# Patient Record
Sex: Male | Born: 1945 | Race: White | Hispanic: No | Marital: Married | Smoking: Former smoker
Health system: Southern US, Community
[De-identification: ages and names within clinical notes are randomized; demographics above are authoritative.]

## PROBLEM LIST (undated history)

## (undated) DIAGNOSIS — F988 Other specified behavioral and emotional disorders with onset usually occurring in childhood and adolescence: Secondary | ICD-10-CM

## (undated) DIAGNOSIS — I499 Cardiac arrhythmia, unspecified: Secondary | ICD-10-CM

## (undated) DIAGNOSIS — D649 Anemia, unspecified: Secondary | ICD-10-CM

## (undated) DIAGNOSIS — I10 Essential (primary) hypertension: Secondary | ICD-10-CM

## (undated) DIAGNOSIS — N200 Calculus of kidney: Secondary | ICD-10-CM

## (undated) DIAGNOSIS — E785 Hyperlipidemia, unspecified: Secondary | ICD-10-CM

## (undated) DIAGNOSIS — N189 Chronic kidney disease, unspecified: Secondary | ICD-10-CM

## (undated) DIAGNOSIS — I2510 Atherosclerotic heart disease of native coronary artery without angina pectoris: Secondary | ICD-10-CM

## (undated) DIAGNOSIS — I219 Acute myocardial infarction, unspecified: Secondary | ICD-10-CM

## (undated) HISTORY — PX: COLONOSCOPY: SHX174

## (undated) HISTORY — PX: HERNIA REPAIR: SHX51

---

## 2006-03-14 ENCOUNTER — Other Ambulatory Visit: Payer: Self-pay

## 2006-03-14 ENCOUNTER — Emergency Department: Payer: Self-pay

## 2006-04-01 ENCOUNTER — Ambulatory Visit: Payer: Self-pay | Admitting: Urology

## 2006-04-09 ENCOUNTER — Other Ambulatory Visit: Payer: Self-pay

## 2006-04-11 ENCOUNTER — Ambulatory Visit: Payer: Self-pay | Admitting: Urology

## 2006-07-17 ENCOUNTER — Ambulatory Visit: Payer: Self-pay | Admitting: Urology

## 2007-01-24 ENCOUNTER — Ambulatory Visit: Payer: Self-pay | Admitting: Urology

## 2007-07-29 ENCOUNTER — Ambulatory Visit: Payer: Self-pay | Admitting: Gastroenterology

## 2007-11-03 ENCOUNTER — Ambulatory Visit: Payer: Self-pay | Admitting: Urology

## 2008-05-27 ENCOUNTER — Other Ambulatory Visit: Payer: Self-pay

## 2008-05-27 ENCOUNTER — Inpatient Hospital Stay: Payer: Self-pay | Admitting: Internal Medicine

## 2008-12-28 ENCOUNTER — Emergency Department: Payer: Self-pay | Admitting: Emergency Medicine

## 2009-01-07 ENCOUNTER — Ambulatory Visit: Payer: Self-pay | Admitting: Urology

## 2009-07-09 IMAGING — CT CT STONE STUDY
1 of 2 series · 15 of 32 positions shown, 19 images · non-contrast
Comparison: none

REASON FOR EXAM: flank pain, eval for stone
COMMENTS:

[Series 2: stone · axial · 0.75mm/px · z∈[-802,-358]mm · 15 of 162 slices shown, 19 images]
[im 7/162  soft-tissue]
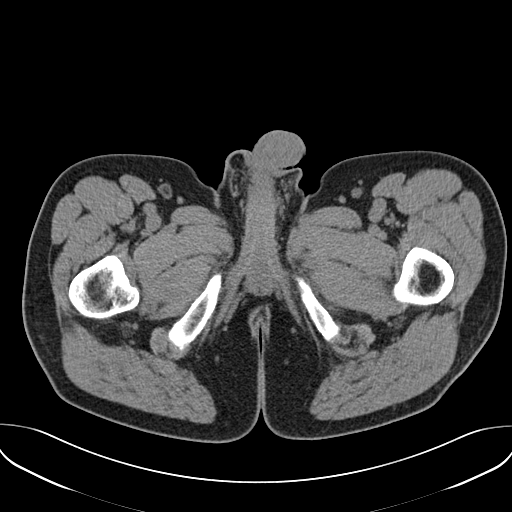
[im 7/162  bone]
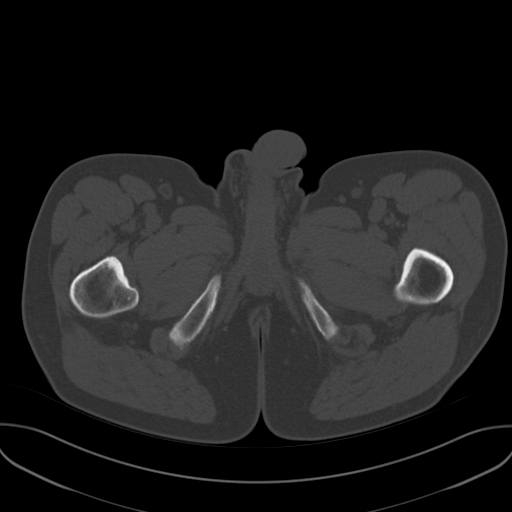
[im 20/162  soft-tissue]
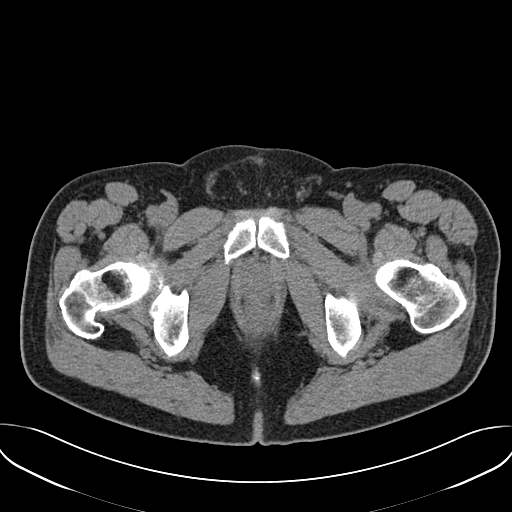
[im 33/162  soft-tissue]
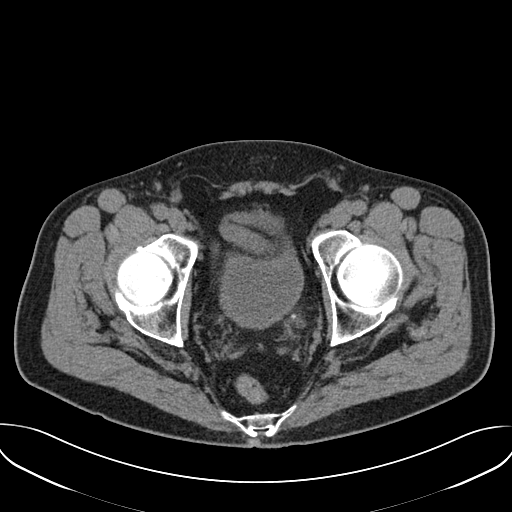
[im 46/162  soft-tissue]
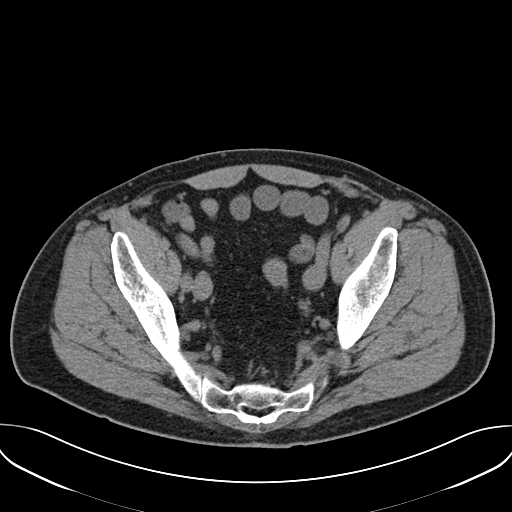
[im 58/162  soft-tissue]
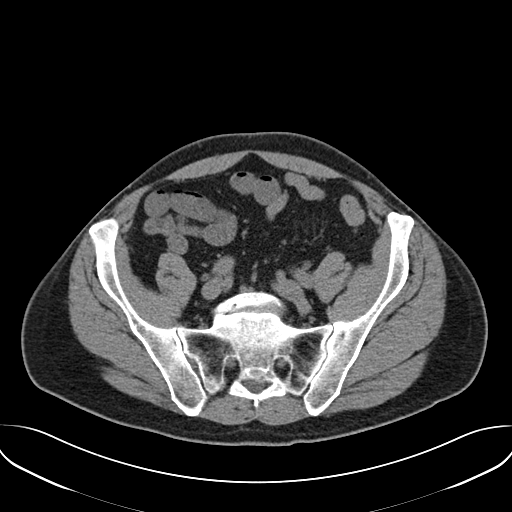
[im 71/162  soft-tissue]
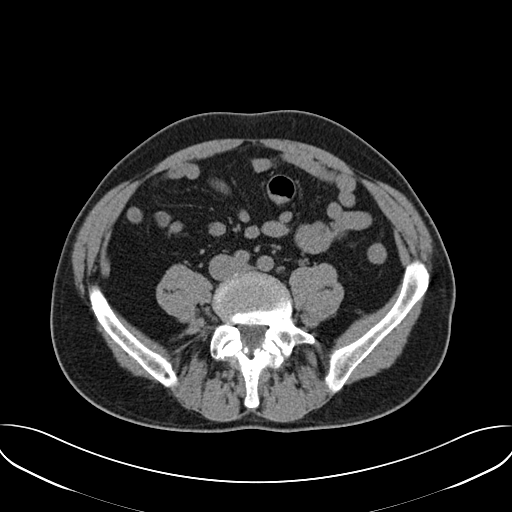
[im 84/162  soft-tissue]
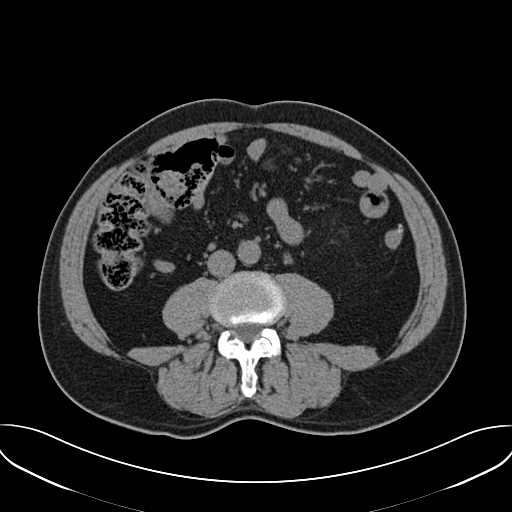
[im 91/162  soft-tissue]
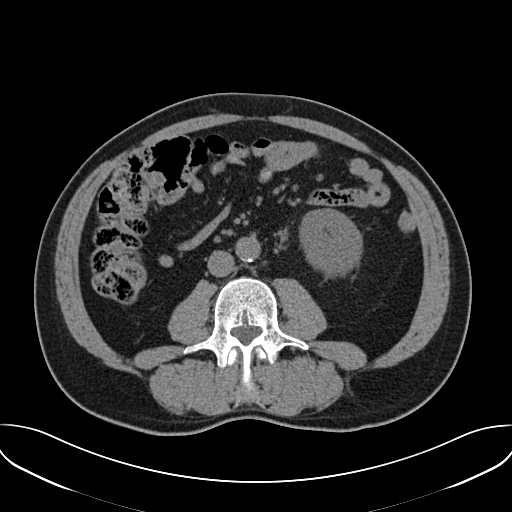
[im 104/162  soft-tissue]
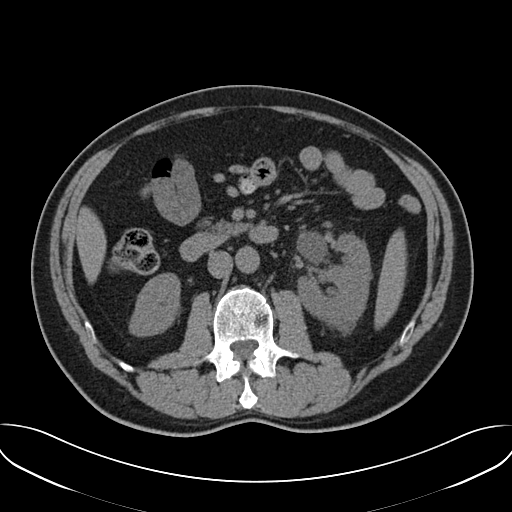
[im 104/162  bone]
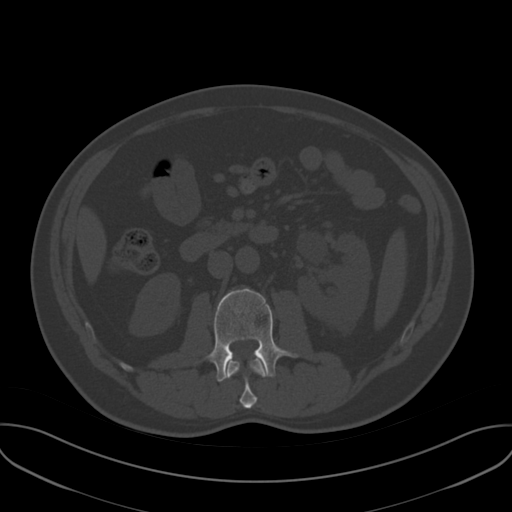
[im 116/162  soft-tissue]
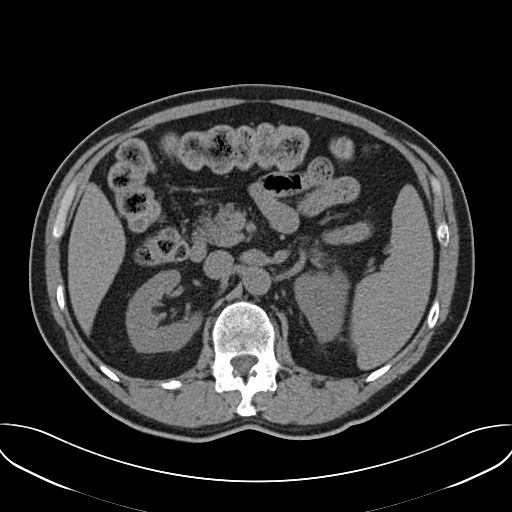
[im 129/162  soft-tissue]
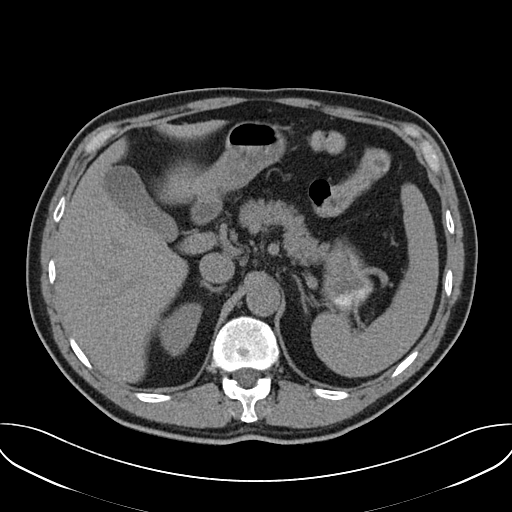
[im 136/162  lung]
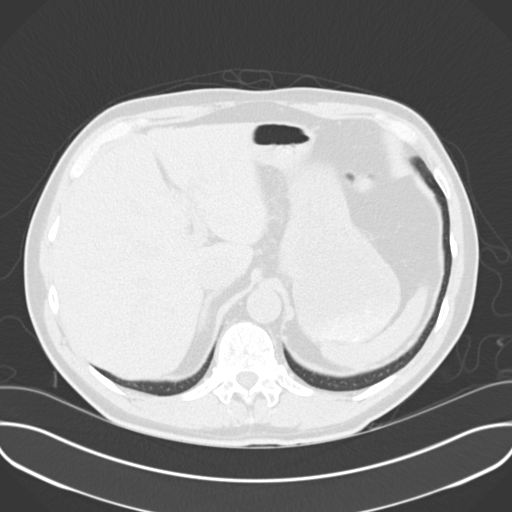
[im 142/162  soft-tissue]
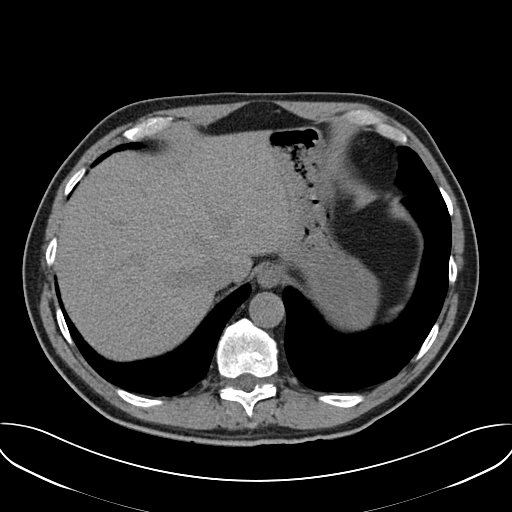
[im 142/162  lung]
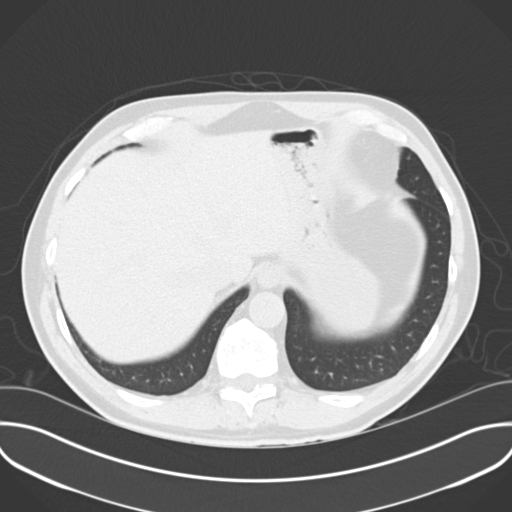
[im 149/162  lung]
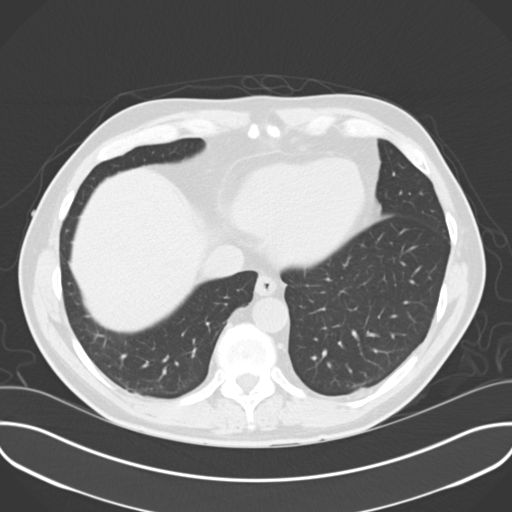
[im 155/162  soft-tissue]
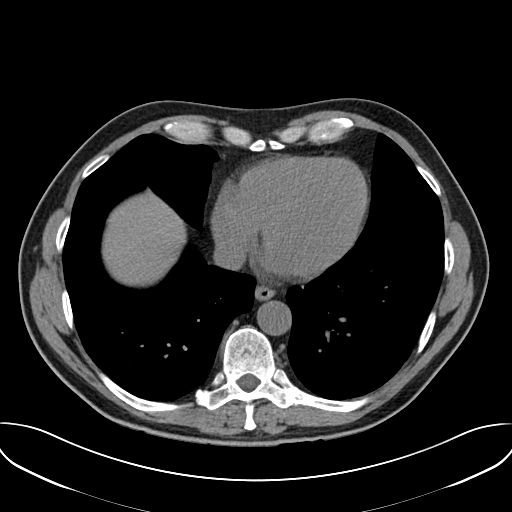
[im 155/162  lung]
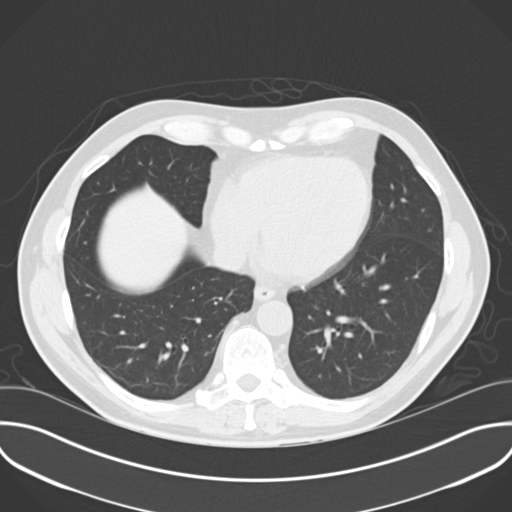

[15 of 32 positions shown; findings below may reference images not displayed]

PROCEDURE:     CT  - CT ABDOMEN /PELVIS WO (STONE)  - December 28, 2008  [DATE]

RESULT:     Axial noncontrast CT scanning was performed through the abdomen
and pelvis at 3 mm intervals and slice thicknesses. Comparison is made to
the study 14 March, 2006. The patient reports a history of urinary tract
stones. Review of 3-dimensional reconstructed images was performed
separately on the [employer] monitor.

On the left there is mild hydronephrosis as well as hydroureter secondary to
an approximately 3 mm diameter stone at the UVJ. This is demonstrated on
image 131. In addition there is a nonobstructing lower pole stone measuring
2 mm in diameter on the left. The right kidney exhibits no calcified stones
nor evidence of obstruction.

The liver, gallbladder, pancreas, spleen, partially distended stomach, and
adrenal glands are normal in appearance. The caliber of the abdominal aorta
is normal. There is no free fluid in the abdomen or pelvis. The partially
distended urinary bladder is normal in appearance. The prostate gland is
mildly enlarged. The lung bases are clear. The lumbar vertebral bodies are
preserved in height.
IMPRESSION: 1. On the left there is mild hydronephrosis and hydroureter secondary to a 3
mm diameter stone at the left ureterovesical junction. A nonobstructing
stone in a lower pole calyx on the left is present as well.
2. I do not see acute abnormality elsewhere within the abdomen or pelvis.

## 2009-07-25 ENCOUNTER — Ambulatory Visit: Payer: Self-pay | Admitting: Urology

## 2010-05-08 ENCOUNTER — Ambulatory Visit: Payer: Self-pay | Admitting: Urology

## 2010-11-17 IMAGING — CR DG ABDOMEN 1V
1 series · 1 of 1 positions shown · non-contrast
Comparison: none

REASON FOR EXAM: calculus of kidney
COMMENTS:

[view not recorded]
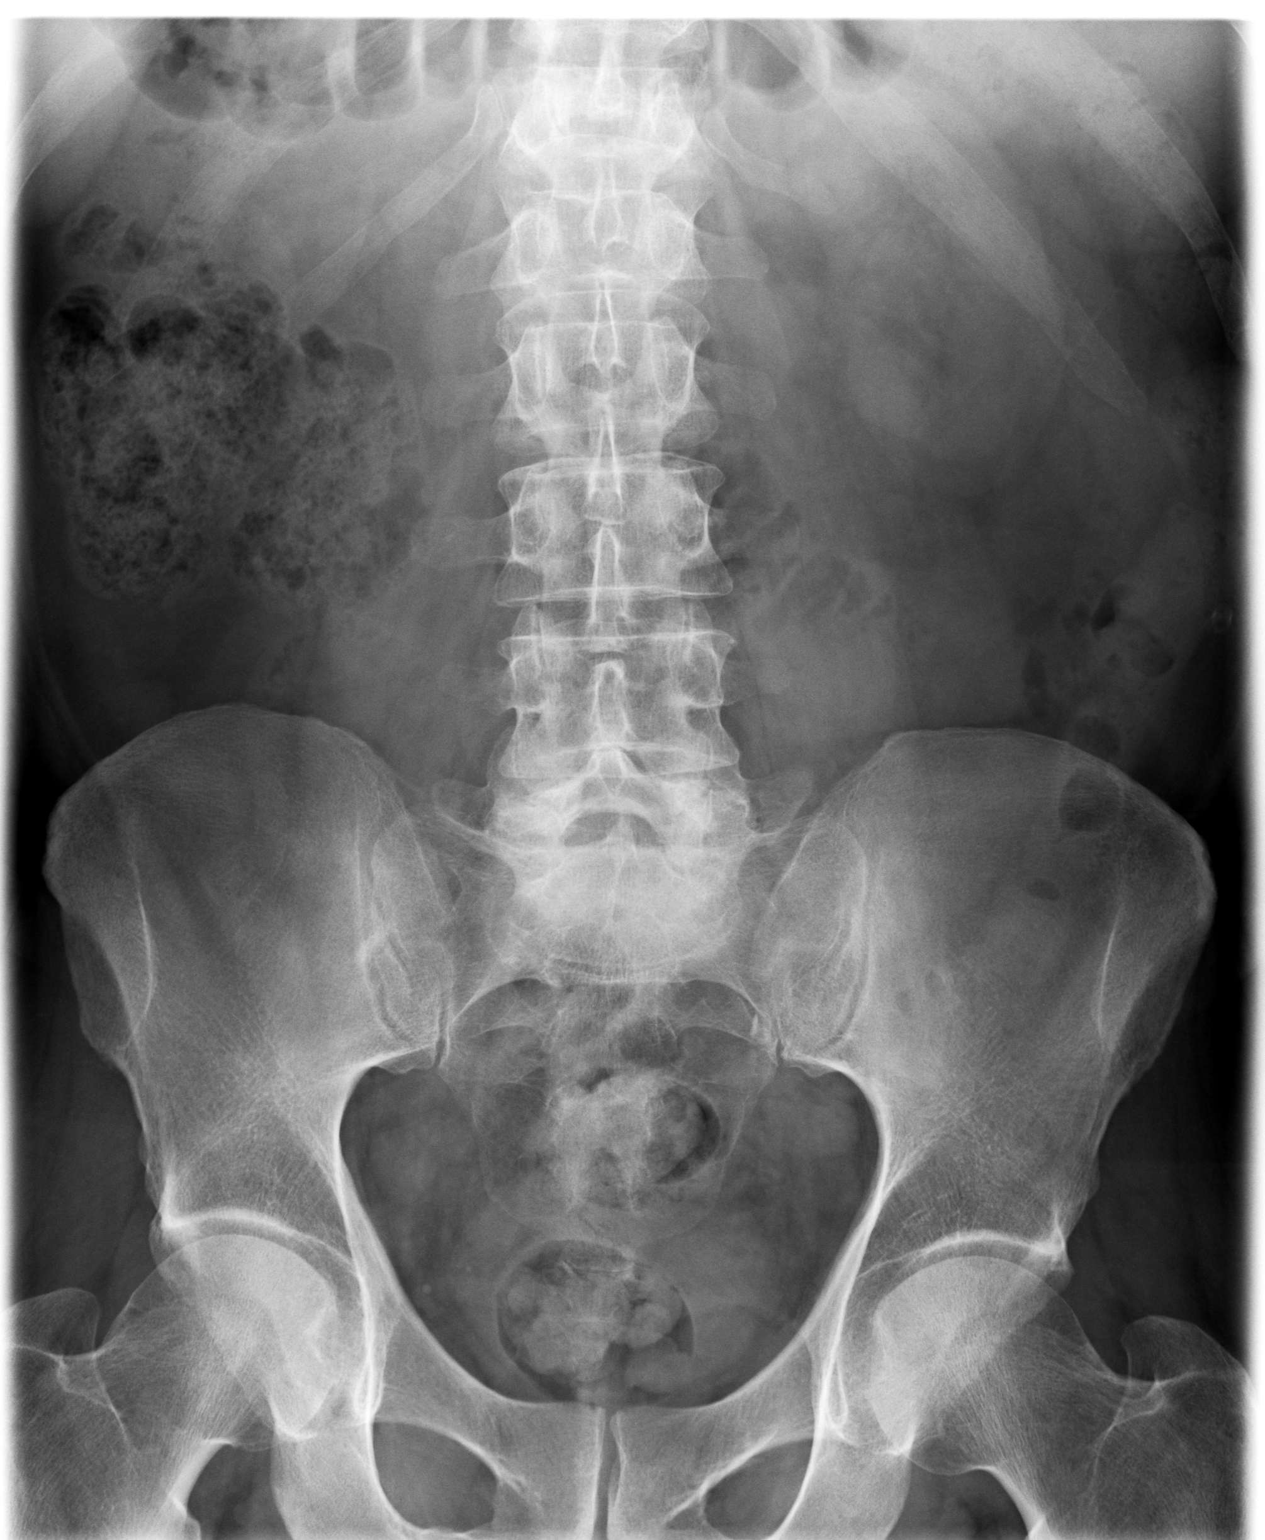

[1 of 1 positions shown; findings below may reference images not displayed]

PROCEDURE:     MDR - MDR KIDNEY URETER BLADDER  - May 08, 2010 [DATE]

RESULT:     Comparison is in June 2009.

The bony structures exhibit no acute abnormality. The bowel gas pattern does
not suggest ileus or obstruction. I cannot exclude constipation. I do not
see abnormal calcifications to suggest a kidney stone.
IMPRESSION: I see no acute intra-abdominal abnormality on this study. I
cannot exclude an element of constipation in the appropriate clinical
setting.

## 2013-03-24 ENCOUNTER — Other Ambulatory Visit: Payer: Self-pay | Admitting: Cardiology

## 2013-03-24 LAB — TROPONIN I: Troponin-I: <0.02 ng/mL

## 2017-01-03 ENCOUNTER — Encounter (INDEPENDENT_AMBULATORY_CARE_PROVIDER_SITE_OTHER): Payer: Medicare Other | Admitting: Ophthalmology

## 2017-01-03 DIAGNOSIS — H33301 Unspecified retinal break, right eye: Secondary | ICD-10-CM | POA: Diagnosis not present

## 2017-01-03 DIAGNOSIS — H2513 Age-related nuclear cataract, bilateral: Secondary | ICD-10-CM

## 2017-01-03 DIAGNOSIS — I10 Essential (primary) hypertension: Secondary | ICD-10-CM

## 2017-01-03 DIAGNOSIS — H35371 Puckering of macula, right eye: Secondary | ICD-10-CM | POA: Diagnosis not present

## 2017-01-03 DIAGNOSIS — H43813 Vitreous degeneration, bilateral: Secondary | ICD-10-CM

## 2017-01-03 DIAGNOSIS — H35033 Hypertensive retinopathy, bilateral: Secondary | ICD-10-CM

## 2017-01-24 ENCOUNTER — Ambulatory Visit (INDEPENDENT_AMBULATORY_CARE_PROVIDER_SITE_OTHER): Payer: Medicare Other | Admitting: Ophthalmology

## 2017-01-24 DIAGNOSIS — H33301 Unspecified retinal break, right eye: Secondary | ICD-10-CM

## 2017-05-27 ENCOUNTER — Ambulatory Visit (INDEPENDENT_AMBULATORY_CARE_PROVIDER_SITE_OTHER): Payer: Medicare Other | Admitting: Ophthalmology

## 2017-05-27 DIAGNOSIS — H35371 Puckering of macula, right eye: Secondary | ICD-10-CM

## 2017-05-27 DIAGNOSIS — H2513 Age-related nuclear cataract, bilateral: Secondary | ICD-10-CM

## 2017-05-27 DIAGNOSIS — H43813 Vitreous degeneration, bilateral: Secondary | ICD-10-CM

## 2017-05-27 DIAGNOSIS — H33301 Unspecified retinal break, right eye: Secondary | ICD-10-CM | POA: Diagnosis not present

## 2018-09-15 ENCOUNTER — Other Ambulatory Visit: Payer: Self-pay

## 2018-09-15 DIAGNOSIS — Z1211 Encounter for screening for malignant neoplasm of colon: Secondary | ICD-10-CM

## 2018-09-29 ENCOUNTER — Encounter: Payer: Self-pay | Admitting: *Deleted

## 2018-09-30 ENCOUNTER — Ambulatory Visit: Payer: Medicare Other | Admitting: Anesthesiology

## 2018-09-30 ENCOUNTER — Ambulatory Visit
Admission: RE | Admit: 2018-09-30 | Discharge: 2018-09-30 | Disposition: A | Discharge status: Home / Self Care | Payer: Medicare Other | Attending: Gastroenterology | Admitting: Gastroenterology

## 2018-09-30 ENCOUNTER — Encounter: Payer: Self-pay | Admitting: Anesthesiology

## 2018-09-30 ENCOUNTER — Encounter
Admission: RE | Disposition: A | Discharge status: Home / Self Care | Payer: Self-pay | Source: Home / Self Care | Attending: Gastroenterology

## 2018-09-30 DIAGNOSIS — E785 Hyperlipidemia, unspecified: Secondary | ICD-10-CM | POA: Diagnosis not present

## 2018-09-30 DIAGNOSIS — I129 Hypertensive chronic kidney disease with stage 1 through stage 4 chronic kidney disease, or unspecified chronic kidney disease: Secondary | ICD-10-CM | POA: Insufficient documentation

## 2018-09-30 DIAGNOSIS — I252 Old myocardial infarction: Secondary | ICD-10-CM | POA: Diagnosis not present

## 2018-09-30 DIAGNOSIS — Z79899 Other long term (current) drug therapy: Secondary | ICD-10-CM | POA: Diagnosis not present

## 2018-09-30 DIAGNOSIS — D120 Benign neoplasm of cecum: Secondary | ICD-10-CM

## 2018-09-30 DIAGNOSIS — Z1211 Encounter for screening for malignant neoplasm of colon: Secondary | ICD-10-CM | POA: Insufficient documentation

## 2018-09-30 DIAGNOSIS — I2510 Atherosclerotic heart disease of native coronary artery without angina pectoris: Secondary | ICD-10-CM | POA: Diagnosis not present

## 2018-09-30 DIAGNOSIS — K641 Second degree hemorrhoids: Secondary | ICD-10-CM | POA: Insufficient documentation

## 2018-09-30 DIAGNOSIS — K5730 Diverticulosis of large intestine without perforation or abscess without bleeding: Secondary | ICD-10-CM | POA: Insufficient documentation

## 2018-09-30 DIAGNOSIS — Z87891 Personal history of nicotine dependence: Secondary | ICD-10-CM | POA: Insufficient documentation

## 2018-09-30 DIAGNOSIS — Z7982 Long term (current) use of aspirin: Secondary | ICD-10-CM | POA: Diagnosis not present

## 2018-09-30 DIAGNOSIS — D123 Benign neoplasm of transverse colon: Secondary | ICD-10-CM | POA: Diagnosis not present

## 2018-09-30 DIAGNOSIS — N189 Chronic kidney disease, unspecified: Secondary | ICD-10-CM | POA: Insufficient documentation

## 2018-09-30 HISTORY — DX: Cardiac arrhythmia, unspecified: I49.9

## 2018-09-30 HISTORY — DX: Anemia, unspecified: D64.9

## 2018-09-30 HISTORY — DX: Essential (primary) hypertension: I10

## 2018-09-30 HISTORY — DX: Acute myocardial infarction, unspecified: I21.9

## 2018-09-30 HISTORY — DX: Other specified behavioral and emotional disorders with onset usually occurring in childhood and adolescence: F98.8

## 2018-09-30 HISTORY — DX: Chronic kidney disease, unspecified: N18.9

## 2018-09-30 HISTORY — DX: Hyperlipidemia, unspecified: E78.5

## 2018-09-30 HISTORY — PX: COLONOSCOPY WITH PROPOFOL: SHX5780

## 2018-09-30 HISTORY — DX: Calculus of kidney: N20.0

## 2018-09-30 HISTORY — DX: Atherosclerotic heart disease of native coronary artery without angina pectoris: I25.10

## 2018-09-30 SURGERY — COLONOSCOPY WITH PROPOFOL
Anesthesia: General

## 2018-09-30 MED ORDER — PROPOFOL 500 MG/50ML IV EMUL
INTRAVENOUS | Status: DC | PRN
  Administered 2018-09-30: 150 ug/kg/min via INTRAVENOUS

## 2018-09-30 MED ORDER — PROPOFOL 10 MG/ML IV BOLUS
INTRAVENOUS | Status: DC | PRN
  Administered 2018-09-30: 50 mg via INTRAVENOUS

## 2018-09-30 MED ORDER — SODIUM CHLORIDE 0.9 % IV SOLN
INTRAVENOUS | Status: DC
  Administered 2018-09-30: 09:00:00 via INTRAVENOUS

## 2018-09-30 MED ORDER — PROPOFOL 10 MG/ML IV BOLUS
INTRAVENOUS | Status: AC
  Filled 2018-09-30: qty 40

## 2018-09-30 NOTE — Anesthesia Preprocedure Evaluation (Addendum)
Anesthesia Evaluation  Patient identified by MRN, date of birth, ID band Patient awake    Reviewed: Allergy & Precautions, NPO status , Patient's Chart, lab work & pertinent test results, reviewed documented beta blocker date and time   Airway Mallampati: II  TM Distance: >3 FB     Dental  (+) Chipped   Pulmonary former smoker,           Cardiovascular hypertension, Pt. on medications and Pt. on home beta blockers + Past MI  + dysrhythmias      Neuro/Psych CVA    GI/Hepatic   Endo/Other    Renal/GU Renal disease     Musculoskeletal   Abdominal   Peds  Hematology   Anesthesia Other Findings   Reproductive/Obstetrics                            Anesthesia Physical Anesthesia Plan  ASA: III  Anesthesia Plan: General   Post-op Pain Management:    Induction: Intravenous  PONV Risk Score and Plan:   Airway Management Planned:   Additional Equipment:   Intra-op Plan:   Post-operative Plan:   Informed Consent: I have reviewed the patients History and Physical, chart, labs and discussed the procedure including the risks, benefits and alternatives for the proposed anesthesia with the patient or authorized representative who has indicated his/her understanding and acceptance.       Plan Discussed with: CRNA  Anesthesia Plan Comments:         Anesthesia Quick Evaluation

## 2018-09-30 NOTE — Anesthesia Postprocedure Evaluation (Signed)
Anesthesia Post Note  Patient: Jonathan Sexton  Procedure(s) Performed: COLONOSCOPY WITH PROPOFOL (N/A )  Patient location during evaluation: Endoscopy Anesthesia Type: General Level of consciousness: awake and alert Pain management: pain level controlled Vital Signs Assessment: post-procedure vital signs reviewed and stable Respiratory status: spontaneous breathing, nonlabored ventilation, respiratory function stable and patient connected to nasal cannula oxygen Cardiovascular status: blood pressure returned to baseline and stable Postop Assessment: no apparent nausea or vomiting Anesthetic complications: no     Last Vitals:  Vitals:   09/30/18 0951 09/30/18 1000  BP:  110/75  Pulse: 74 (!) 55  Resp: 14 14  Temp:    SpO2: 99% 98%    Last Pain:  Vitals:   09/30/18 0938  TempSrc: Tympanic                 Allisa Einspahr S

## 2018-09-30 NOTE — Op Note (Signed)
[facility] Gastroenterology Patient Name: Jonathan Sexton Procedure Date: 09/30/2018 9:14 AM MRN: [id number] Account #: [id number] Date of Birth: [birth year] Admit Type: Outpatient Age: 73 Room: [facility] ENDO ROOM 4 Gender: Male Note Status: Finalized Procedure:            Colonoscopy Indications:          Screening for colorectal malignant neoplasm Providers:            Lucilla Lame MD, MD Referring MD:         Sofie Hartigan (Referring MD) Medicines:            Propofol per Anesthesia Complications:        No immediate complications. Procedure:            Pre-Anesthesia Assessment:                       - Prior to the procedure, a History and Physical was                        performed, and patient medications and allergies were                        reviewed. The patient's tolerance of previous                        anesthesia was also reviewed. The risks and benefits of                        the procedure and the sedation options and risks were                        discussed with the patient. All questions were                        answered, and informed consent was obtained. Prior                        Anticoagulants: The patient has taken no previous                        anticoagulant or antiplatelet agents. ASA Grade                        Assessment: II - A patient with mild systemic disease.                        After reviewing the risks and benefits, the patient was                        deemed in satisfactory condition to undergo the                        procedure.                       After obtaining informed consent, the colonoscope was                        passed under direct vision. Throughout the procedure,  the patient's blood pressure, pulse, and oxygen                        saturations were monitored continuously. The                        Colonoscope was introduced through the anus and       advanced to the the cecum, identified by appendiceal                        orifice and ileocecal valve. The colonoscopy was                        performed without difficulty. The patient tolerated the                        procedure well. The quality of the bowel preparation                        was excellent. Findings:      The perianal and digital rectal examinations were normal.      A 6 mm polyp was found in the cecum. The polyp was sessile. The polyp       was removed with a cold snare. Resection and retrieval were complete.      A 2 mm polyp was found in the transverse colon. The polyp was sessile.       The polyp was removed with a cold biopsy forceps. Resection and       retrieval were complete.      Multiple small-mouthed diverticula were found in the sigmoid colon.      Non-bleeding internal hemorrhoids were found during retroflexion. The       hemorrhoids were Grade II (internal hemorrhoids that prolapse but reduce       spontaneously). Impression:           - One 6 mm polyp in the cecum, removed with a cold                        snare. Resected and retrieved.                       - One 2 mm polyp in the transverse colon, removed with                        a cold biopsy forceps. Resected and retrieved.                       - Diverticulosis in the sigmoid colon.                       - Non-bleeding internal hemorrhoids. Recommendation:       - Discharge patient to home.                       - Resume previous diet.                       - Continue present medications.                       - Await pathology results.                       -  Repeat colonoscopy in 5 years if polyp adenoma and 10                        years if hyperplastic Procedure Code(s):    --- Professional ---                       [phone], Colonoscopy, flexible; with removal of tumor(s),                        polyp(s), or other lesion(s) by snare technique                       [id number], 67,  Colonoscopy, flexible; with biopsy, single                        or multiple Diagnosis Code(s):    --- Professional ---                       Z12.11, Encounter for screening for malignant neoplasm                        of colon                       D12.0, Benign neoplasm of cecum                       D12.3, Benign neoplasm of transverse colon (hepatic                        flexure or splenic flexure) CPT copyright 2018 American Medical Association. All rights reserved. The codes documented in this report are preliminary and upon coder review may  be revised to meet current compliance requirements. Lucilla Lame MD, MD 09/30/2018 9:33:41 AM This report has been signed electronically. Number of Addenda: 0 Note Initiated On: 09/30/2018 9:14 AM Scope Withdrawal Time: 0 hours 7 minutes 50 seconds  Total Procedure Duration: 0 hours 10 minutes 17 seconds       [facility]

## 2018-09-30 NOTE — H&P (Signed)
Lucilla Lame, MD [address] [place], [place] [address] Phone: [phone] Fax : [phone]  Primary Care Physician:  Sofie Hartigan, MD Primary Gastroenterologist:  Dr. Allen Norris  Pre-Procedure History & Physical: HPI:  Jonathan Sexton is a 73 y.o. male is here for a screening colonoscopy.   Past Medical History:  Diagnosis Date  . ADD (attention deficit disorder)   . Anemia   . Chronic kidney disease   . Coronary artery disease   . Dysrhythmia   . Hyperlipidemia   . Hypertension   . Kidney stones   . Kidney stones   . Myocardial infarction [facility])     Past Surgical History:  Procedure Laterality Date  . COLONOSCOPY    . HERNIA REPAIR      Prior to Admission medications   Medication Sig Start Date End Date Taking? Authorizing Provider  amphetamine-dextroamphetamine (ADDERALL XR) 20 MG 24 hr capsule Take 20 mg by mouth daily.   Yes [provider]  aspirin EC 81 MG tablet Take 81 mg by mouth daily.   Yes [provider]  metoprolol succinate (TOPROL-XL) 100 MG 24 hr tablet Take 100 mg by mouth daily. Take with or immediately following a meal.   Yes [provider]  nitroGLYCERIN (NITROSTAT) 0.4 MG SL tablet Place 0.4 mg under the tongue every 5 (five) minutes as needed for chest pain.   Yes [provider]  simvastatin (ZOCOR) 40 MG tablet Take 40 mg by mouth daily.   Yes [provider]  tamsulosin (FLOMAX) 0.4 MG CAPS capsule Take 0.4 mg by mouth.   Yes [provider]    Allergies as of 09/16/2018  . (Not on File)    History reviewed. No pertinent family history.  Social History   Socioeconomic History  . Marital status: Married    Spouse name: Not on file  . Number of children: Not on file  . Years of education: Not on file  . Highest education level: Not on file  Occupational History  . Not on file  Social Needs  . Financial resource strain: Not on file  . Food insecurity:   Worry: Not on file    Inability: Not on file  . Transportation needs:    Medical: Not on file    Non-medical: Not on file  Tobacco Use  . Smoking status: Former Research scientist (life sciences)  . Smokeless tobacco: Never Used  Substance and Sexual Activity  . Alcohol use: Yes    Comment: rarely,none last 24hrs  . Drug use: Not on file  . Sexual activity: Not on file  Lifestyle  . Physical activity:    Days per week: Not on file    Minutes per session: Not on file  . Stress: Not on file  Relationships  . Social connections:    Talks on phone: Not on file    Gets together: Not on file    Attends religious service: Not on file    Active member of club or organization: Not on file    Attends meetings of clubs or organizations: Not on file    Relationship status: Not on file  . Intimate partner violence:    Fear of current or ex partner: Not on file    Emotionally abused: Not on file    Physically abused: Not on file    Forced sexual activity: Not on file  Other Topics Concern  . Not on file  Social History Narrative  . Not on file  Review of Systems: See HPI, otherwise negative ROS  Physical Exam: BP (!) 152/88   Pulse (!) 58   Temp (!) 96.7 F (35.9 C) (Tympanic)   Resp 20   Ht 5\' 10"  (1.778 m)   Wt 90.3 kg   SpO2 95%   BMI 28.55 kg/m  General:   Alert,  pleasant and cooperative in NAD Head:  Normocephalic and atraumatic. Neck:  Supple; no masses or thyromegaly. Lungs:  Clear throughout to auscultation.    Heart:  Regular rate and rhythm. Abdomen:  Soft, nontender and nondistended. Normal bowel sounds, without guarding, and without rebound.   Neurologic:  Alert and  oriented x4;  grossly normal neurologically.  Impression/Plan: Jonathan Sexton is now here to undergo a screening colonoscopy.  Risks, benefits, and alternatives regarding colonoscopy have been reviewed with the patient.  Questions have been answered.  All parties agreeable.

## 2018-09-30 NOTE — Transfer of Care (Signed)
Immediate Anesthesia Transfer of Care Note  Patient: Jonathan Sexton  Procedure(s) Performed: COLONOSCOPY WITH PROPOFOL (N/A )  Patient Location: Endoscopy Unit  Anesthesia Type:General  Level of Consciousness: awake, alert  and oriented  Airway & Oxygen Therapy: Patient Spontanous Breathing and Patient connected to nasal cannula oxygen  Post-op Assessment: Report given to RN and Post -op Vital signs reviewed and stable  Post vital signs: Reviewed and stable  Last Vitals:  Vitals Value Taken Time  BP    Temp    Pulse    Resp    SpO2      Last Pain:  Vitals:   09/30/18 0904  TempSrc: Tympanic         Complications: No apparent anesthesia complications

## 2018-09-30 NOTE — Anesthesia Post-op Follow-up Note (Signed)
Anesthesia QCDR form completed.        

## 2018-10-01 ENCOUNTER — Encounter: Payer: Self-pay | Admitting: Gastroenterology

## 2018-10-01 LAB — SURGICAL PATHOLOGY

## 2023-12-10 NOTE — Progress Notes (Signed)
 Jonathan Sexton is a 78 y.o. male that comes today for the following problem(s):   Chief Complaint  Patient presents with  . Follow-up    fasting    HPI: Patient in the office for follow-up of chronic medical additions including hypertension, hyperlipidemia, ADD and kidney stones.  He reports he is feeling well and is without acute complaint or concern.  His blood pressures have remained well-controlled with the metoprolol.  He denies headaches, vision changes, speech problems, focal neurologic symptoms, chest pain, shortness of breath or palpitations.  He he continues to take his simvastatin regularly for the control of his cholesterol.  He is trying to eat right and remains active.  He notes he exercises on the treadmill regularly.  He states his ADD remains well-controlled with the Adderall.  He notes he is able to focus on his tasks better and complete them in a more timely fashion with the assistance of the medication.  He denies problems with kidney stones between visits and continues to take his tamsulosin regularly.  He denies flank pain or hematuria.  He denies side effect problems with his medications.   Goals     . Maintain health/healthy lifestyle        Patient Active Problem List  Diagnosis  . Anemia, unspecified  . Coronary atherosclerosis of native coronary artery  . HLD (hyperlipidemia), unspecified  . Pure hypercholesterolemia  . ADD (attention deficit disorder)  . Hyperlipidemia  . Hypertension  . Kidney stones     Past Medical History:  Diagnosis Date  . ADD (attention deficit disorder)   . Anemia, unspecified   . Coronary atherosclerosis of native coronary artery   . HLD (hyperlipidemia)   . Hypertension   . Pure hypercholesterolemia      Past Surgical History:  Procedure Laterality Date  . CARDIAC CATHETERIZATION    . HERNIA REPAIR     Left hernia    Social History   Socioeconomic History  . Marital status: Married  Tobacco Use  . Smoking  status: Former    Types: Cigarettes  . Smokeless tobacco: Never  . Tobacco comments:    quit 1972  Substance and Sexual Activity  . Alcohol use: Yes    Alcohol/week: 1.0 - 2.0 standard drink of alcohol    Types: 1 - 2 Standard drinks or equivalent per week    Comment: occasionally  . Drug use: No   Social Drivers of Corporate Investment Banker Strain: Low Risk  (04/08/2023)   Overall Financial Resource Strain (CARDIA)   . Difficulty of Paying Living Expenses: Not hard at all  Food Insecurity: No Food Insecurity (04/08/2023)   Hunger Vital Sign   . Worried About Programme Researcher, Broadcasting/film/video in the Last Year: Never true   . Ran Out of Food in the Last Year: Never true  Transportation Needs: No Transportation Needs (04/08/2023)   PRAPARE - Transportation   . Lack of Transportation (Medical): No   . Lack of Transportation (Non-Medical): No  Housing Stability: Unknown (04/08/2023)   Housing Stability Vital Sign   . Unable to Pay for Housing in the Last Year: No   . Homeless in the Last Year: No    Family History  Problem Relation Name Age of Onset  . ADD / ADHD Son    . Diabetes Father    . Stroke Other         family hx     No Known Allergies  Prior to Admission medications  Medication Sig Taking? Last Dose  aspirin 81 MG EC tablet Take 81 mg by mouth once daily. Yes Taking  dextroamphetamine-amphetamine (ADDERALL XR) 15 MG XR capsule Take 1 capsule (15 mg total) by mouth every morning Yes Taking  glucosamine-chondroitin 500-400 mg tablet Take 1 tablet by mouth 2 (two) times daily. Yes Taking  metoprolol succinate (TOPROL-XL) 100 MG XL tablet TAKE 1 TABLET BY MOUTH ONCE  DAILY Yes Taking  multivitamin tablet Take 1 tablet by mouth once daily. Yes Taking  nitroGLYcerin (NITROSTAT) 0.4 MG SL tablet Place 0.4 mg under the tongue every 5 (five) minutes as needed for Chest pain May take up to 3 doses. Yes PRN Not Currently Taking  simvastatin (ZOCOR) 40 MG tablet TAKE 1 TABLET BY MOUTH  EVERY  NIGHT Yes Taking  tamsulosin (FLOMAX) 0.4 mg capsule TAKE 1 CAPSULE BY MOUTH 1 TIME  DAILY 30 MINUTES AFTER THE SAME  MEAL EACH DAY Yes Taking     Objective:  BP (!) 126/90 (BP Location: Left upper arm, Patient Position: Sitting, BP Cuff Size: Large Adult)   Pulse 68   Ht 177.8 cm (5' 10)   Wt 90.7 kg (200 lb)   SpO2 96%   BMI 28.70 kg/m   Physical Examination:  GENERAL:  The patient is alert, oriented and in no acute distress.  HEENT:  Head is normocephalic/atraumatic.  Pupils equal, round and reactive to light and accommodation.  Extraocular movements intact.  Nose and throat are clear. NECK/LYMPHATICS:  Supple without thyromegaly or lymphadenopathy.   RESPIRATORY:  Chest wall is within normal limits.   Clear to auscultation and percussion.   CARDIAC:  Regular rate and rhythm, normal S1 and S2 without murmurs, rubs or gallops.   VASCULAR:  Distal pulses 2+.   GASTROINTESTINAL  Soft. No organomegaly or tenderness found.    MUSCULOSKELETAL:  No cyanosis, clubbing or edema noted.   NEUROLOGIC:  The patient is alert and oriented x 3.  No focal deficits.       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  Encouraged regular aerobic activity 3-4 days per week. Reviewed most recent labs with patient.  Await above ordered labs Continue to work on coping mechanisms for ADD Continue to stay well-hydrated to avoid problems with kidney stones  Return in about 6 months (around 06/10/2024) for Regular follow up,  Fasting.

## 2024-04-01 NOTE — Progress Notes (Signed)
 Established Patient Visit   Chief Complaint: Chief Complaint  Patient presents with  . Coronary Artery Disease  . Hypertension    One year   Date of Service: 04/01/2024 Date of Birth: 1945/10/27 PCP: Jeffie Cheryl Therman Mickey., MD  History of Present Illness: Jonathan Sexton is a 78 y.o.male patient who returns for   1.  Essential hypertension  2.  Hyperlipidemia  3.  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  LBBB  The patient returns today for a 1 year follow-up and reports doing better.  He denies chest pain or shortness of breath. He denies palpitations or heart racing. He denies peripheral edema. He denies presyncope or syncope. The patient is active by walking on his treadmill at an incline 2x/week. ECG 03/06/2023 reveals sinus rhythm at a rate of 63 bpm with 1st degree AV block and known LBBB.  The patient underwent ETT sestamibi study on 03/25/2013 and exercised 8 minutes and 5 seconds on a Bruce protocol without chest pain, ECG changes or evidence of exercise-induced perfusion defects.     The patient has essential hypertension, systolic blood pressure mildly elevated, currently on metoprolol succinate, which is well-tolerated without apparent side effects.blood pressures at home have been in normal range.  The patient tries to follow a low-sodium, no added salt diet.   The patient has hyperlipidemia; LDL cholesterol was 65 on/15/2025 on simvastatin, which is well tolerated without apparent side effects, followed by his primary care provider.    Past Medical and Surgical History  Past Medical History Past Medical History:  Diagnosis Date  . ADD (attention deficit disorder)   . Anemia, unspecified   . Coronary atherosclerosis of native coronary artery   . HLD (hyperlipidemia)   . Hypertension   . Pure hypercholesterolemia     Past Surgical History He has a past surgical history that includes Cardiac catheterization and Hernia repair.    Medications and Allergies  Current Medications  Current Outpatient Medications  Medication Sig Dispense Refill  . aspirin 81 MG EC tablet Take 81 mg by mouth once daily.    SABRA dextroamphetamine-amphetamine (ADDERALL XR) 15 MG XR capsule Take 1 capsule (15 mg total) by mouth every morning 30 capsule 0  . glucosamine-chondroitin 500-400 mg tablet Take 1 tablet by mouth 2 (two) times daily.    . metoprolol SUCCinate (TOPROL-XL) 100 MG XL tablet TAKE 1 TABLET BY MOUTH ONCE  DAILY 100 tablet 2  . multivitamin tablet Take 1 tablet by mouth once daily.    . nitroGLYcerin (NITROSTAT) 0.4 MG SL tablet Place 0.4 mg under the tongue every 5 (five) minutes as needed for Chest pain May take up to 3 doses.    . simvastatin (ZOCOR) 40 MG tablet TAKE 1 TABLET BY MOUTH EVERY  NIGHT 100 tablet 2  . tamsulosin (FLOMAX) 0.4 mg capsule TAKE 1 CAPSULE BY MOUTH 1 TIME  DAILY 30 MINUTES AFTER THE SAME  MEAL EACH DAY 100 capsule 3   No current facility-administered medications for this visit.    Allergies: Patient has no known allergies.  Social and Family History  Social History  reports that he has quit smoking. His smoking use included cigarettes. He has never used smokeless tobacco. He reports current alcohol use of about 1.0 - 2.0 standard drink of alcohol per week. He reports that he does not use drugs.  Family History Family History  Problem Relation Name Age of Onset  . ADD / ADHD Son    .  Diabetes Father    . Stroke Other         family hx    Review of Systems   Review of Systems: The patient denies chest pain, shortness of breath, orthopnea, paroxysmal nocturnal dyspnea, pedal edema, palpitations, heart racing, presyncope, syncope, with fatigue this morning. Review of 10 Systems is negative except as described above.  Physical Examination   Vitals: BP (!) 150/82   Pulse 78   Ht 177.8 cm (5' 10)   Wt 89.4 kg (197 lb)   SpO2 96%   BMI 28.27 kg/m  Ht:177.8 cm (5' 10) Wt:89.4 kg (197  lb) ADJ:Anib surface area is 2.1 meters squared. Body mass index is 28.27 kg/m.  General: Alert and oriented. Well-appearing. No acute distress. HEENT: Pupils equally reactive to light and accomodation    Neck: Supple, no JVD Lungs: Normal effort of breathing; clear to auscultation bilaterally; no wheezes, rales, rhonchi Heart: Regular rate and rhythm. No murmur, rub, or gallop Abdomen: nondistended Extremities: no cyanosis, clubbing, or edema Peripheral Pulses: 2+ radial  Skin: Warm, dry, no diaphoresis  Assessment   78 y.o. male with  1. Atherosclerosis of native coronary artery of native heart without angina pectoris   2. Primary hypertension   3. Pure hypercholesterolemia   4. Hyperlipidemia, unspecified hyperlipidemia type    78 year old gentleman with known coronary artery disease, currently without chest pain, and known LBBB. The patient has essential hypertension, blood pressure well controlled on current BP medications. The patient has hyperlipidemia, with good control of LDL cholesterol, on simvastatin.   Plan   1.  Continue current medications 2.  Recommend low-sodium diet 3.  Recommend Mediterranean Diet 4.  Continue simvastatin for hyperlipidemia management  5.  Heart healthy diet printed instructions given to the patient 6.  Advised patient to exercise regularly. 7.  Return to clinic for follow-up in 1 year  No orders of the defined types were placed in this encounter.   Return in about 1 year (around 04/01/2025).  MARSA DOOMS, MD PhD [facility]
